# Patient Record
Sex: Female | Born: 1985 | Race: White | Hispanic: No | Marital: Married | Smoking: Never smoker
Health system: Western US, Academic
[De-identification: ages and names within clinical notes are randomized; demographics above are authoritative.]

## PROBLEM LIST (undated history)

## (undated) DIAGNOSIS — F5000 Anorexia nervosa, unspecified: Secondary | ICD-10-CM

## (undated) DIAGNOSIS — F411 Generalized anxiety disorder: Secondary | ICD-10-CM

## (undated) DIAGNOSIS — L700 Acne vulgaris: Secondary | ICD-10-CM

## (undated) HISTORY — DX: Generalized anxiety disorder: F41.1

## (undated) HISTORY — DX: Acne vulgaris: L70.0

## (undated) HISTORY — PX: NO PAST SURGERIES: SHX2092

## (undated) HISTORY — DX: Anorexia nervosa, unspecified: F50.00

## (undated) NOTE — Nursing Note (Signed)
 Formatting of this note is different from the original.  Gave patient 36 Week AVS Instructions     Pended CBC and Syphilis lab orders.  GBS Culture, pended order and setup for collection     Psychosocial Screening completed Yes    PSYCHOSOCIAL SCREENING To

## (undated) NOTE — Progress Notes (Signed)
 Formatting of this note is different from the original.  Images from the original note were not included.  CC: return OB  67 year old [id number] female at [redacted]w[redacted]d here for return OB visit.  No leakage of fluid, vaginal bleeding, dysuria, preeclampsia symptoms,

---
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
## 2010-01-19 ENCOUNTER — Ambulatory Visit (HOSPITAL_COMMUNITY): Admission: RE | Admit: 2010-01-19 | Discharge: 2010-01-19 | Payer: Self-pay | Admitting: Obstetrics & Gynecology

## 2010-07-23 ENCOUNTER — Encounter: Payer: Self-pay | Admitting: Family Medicine

## 2010-09-16 LAB — ABO/RH: ABO/RH(D): A POS

## 2010-11-17 NOTE — Discharge Summary (Signed)
[name], Marquez                 ACCOUNT NO.:  [id number]   MEDICAL RECORD NO.:  [id number]          PATIENT TYPE:  INP   LOCATION:  6150                         FACILITY:  [facility]   PHYSICIAN:  Orie Rout, M.D.DATE OF BIRTH:  Dec 23, 1985   DATE OF ADMISSION:  07/29/2004  DATE OF DISCHARGE:                                 DISCHARGE SUMMARY   CONSULTING PHYSICIAN:  Dr. Candis Musa of cardiology.   PRINCIPAL DIAGNOSES:  1.  Anorexia nervosa.  2.  Bradycardia.  3.  Renal insufficiency.   PRINCIPAL PROCEDURES:  1.  July 29, 2004 - EKG showing low voltage, heart rate of 29, calculated      QTC of 0.37.  2.  Continuous cardiac monitoring with heart rate going from 28 to 61.  3.  IV hydration with maintenance IV fluids D5/half normal saline with 20 of      KCl per liter.   HISTORY:  Ms. Colleen Marquez is a 25 year old female with a diagnosis of anorexia  nervosa followed by her primary care physician, Dr. Maryellen Pile who  presented on July 29, 2004 after she had undergone Holter monitoring and  her heart rate was found to be in the 30s.  She was admitted for  asymptomatic bradycardia for meeting the criteria for inpatient admission  for anorexia nervosa.  She had been followed very closely by her primary  care physician prior to admission.  Her weight in November 2004 was 139.  He  saw her the next time on May 04, 2004 when her weight was 120.  At that  time she was feeling tired, lightheaded, especially with tumbling.  She is a  Writer.  At that time labs were normal including a CBC, mono  spot, EBD serologies, TSH, UA, with the exception of her BUN which was 25  and a creatinine of 1.1.  She was referred in December 2005 to a pediatric  psychiatrist, Dr. Cyndia Skeeters, who began following her.  Unfortunately, she did  not develop a good relationship with him and was eventually referred to Dr.  Richardson Dopp, a psychologist.  Her family, her primary care physician, and Bryn have  been trying to manage this as an outpatient but she was seen weekly by her  primary care physician where her vital signs were taken.  Her heart rate  progressively got lower.  On July 08, 2004 it was 70 with a blood pressure  of 84/30; on July 15, 2004 it was 65 with a blood pressure of 84/38; the  next week had similar values but things came to a head on July 29, 2004  when she was seen and her heart rate was 32 with a blood pressure of 86/48.  A Holter monitor was placed and it was reviewed by Dr. Candis Musa.  She was  admitted for inpatient management of this significant bradycardia.   LABORATORY DATA:  Admission laboratory data on July 29, 2004:  WBC 5.8,  HGB 13.1, HCT 37.3, PLT 198.  Sodium 137, potassium 4.3, chloride 104,  bicarb 25, BUN 32, creatinine 1.5, glucose 72, alk phos 46, AST  35, ALT 45,  total protein 6.4, albumin 4.2, calcium 9.3.  UA:  Specific gravity 1.008,  pH 7, otherwise negative.  Urine pregnancy test negative.  Urine drug screen  negative.  Magnesium on July 30, 2004 2.1.  Phosphorus on July 30, 2004 3.2.  FSH 0.6 - which is low.  Luteinizing hormone less than 0.1 - also  low.   Laboratory data prior to discharge on July 31, 2004:  Sodium 140,  potassium 4.1, chloride 108, bicarb 29, BUN 17, creatinine 1.3, glucose 97,  calcium 9.3, phosphorus 3.1, magnesium 2.2.   HOSPITAL COURSE:  #1 - ANOREXIA NERVOSA.  The patient was seen by our  pediatric psychologist as well as our nutritionist.  We attempted to come up  with an inpatient strategy that could be followed by nursing, physician, and  the ancillary staff here.  This included strict bedrest, constant monitoring  by nursing and family, and caloric goals, as well as goals for her weight  gain.  In terms of her nutrition, she was started on maintenance IV fluids  D5/half normal saline with 20 of KCl at maintenance.  This was continued  throughout hospitalization.  In terms of her caloric  intake, we started with  a goal of 1000 Kcal.  This was increased to 1200 Kcal on hospital day #2.  On hospital day #3, she was seen by the nutritionist.  At that point, she  had already lost weight.  Her admission weight was 49.8 kg on July 29, 2004.  Her weight on July 31, 2004 was 49.1 kg.  We explained to her that  we would increase her daily Kcal by 200 each day if she failed to gain 0.2  kg.  This has been an ongoing struggle with nursing and family.  We  therefore felt very strongly from the beginning that she should be part of a  dedicated eating disorders unit that has the expertise and staffing  available for closer monitoring and implantation of rules.  In terms of her  bedrest, both the patient and parents were very upset with the imposition of  strict bedrest on hospital day #2.  They asked [place] for cardiology input to  determine the safety and necessity of bedrest.  She was seen on hospital day  #3 by Dr. Candis Musa.  He felt that strict bedrest was not indicated from a  cardiac standpoint.  He understood that this might be useful from an eating  disorder treatment plan perspective but did not feel that this was necessary  to be done prior to transfer to a dedicated unit.  He therefore discontinued  the use of strict bedrest.   #2 - BRADYCARDIA.  The patient's initial heart rate was 29.  This came up  somewhat during hospitalization and on the morning of July 31, 2004  ranged from 41 to 61.  She did continue to have periods of dipping to the  30s.  She was monitored continually on telemetry.  She did not have any  significant pauses.  In addition, she did not have pauses over 2 seconds on  her Holter monitoring per Dr. Candis Musa.  She was never symptomatic during her  hospitalization with the bradycardia.   #3 - PSYCHIATRY.  The patient was evaluated by our pediatric psychologist,  Dr. Colvin Caroli.  Unfortunately, there was some frustration with Dr. Dixon Boos style on the  part of the parents and the patient.  Good relationship  was not established.  The  patient had been started on Zoloft 50 mg p.o.  daily on July 28, 2004 prior to hospitalization by her primary care  physician.  This was continued during her hospitalization.   #4 - RENAL INSUFFICIENCY.  The patient was noted to have a rising creatinine  as an outpatient.  As mentioned, she had a value of 1.1 on May 05, 2004.  That increased to 1.3 on June 22, 2004 and to 1.5 on July 24, 2004.  On admission, her creatinine was 1.5 and decreased to 1.3 prior to discharge  with hydration.  This is felt to be likely prerenal in nature but should be  continued to be followed twice weekly until she reaches her baseline value  of 1.1 or below.   #5 - DISPOSITION.  At the time of dictation, the parents were investigating  several inpatient treatment facilities.  We have been in touch with the [facility]  eating disorder unit and are attempting to transfer the patient there.  There are beds available and if the parents and patient agree that this is  the best treatment at this time, we plan to transfer the patient on August 01, 2004.   DISCHARGE INSTRUCTIONS TO THE PATIENT AND FAMILY:  Proceed as planned  directly to the eating disorders unit at [facility].  There, Iriel will be  instructed to follow their protocol.   DISCHARGE MEDICATIONS:  1.  Zoloft 50 mg p.o. daily.  2.  Tylenol 650 mg p.o. q.4h. p.r.n.    HP/MEDQ  D:  07/31/2004  T:  07/31/2004  Job:  [id number]

## 2016-06-28 ENCOUNTER — Ambulatory Visit (INDEPENDENT_AMBULATORY_CARE_PROVIDER_SITE_OTHER): Payer: BC Managed Care – PPO | Admitting: Internal Medicine

## 2016-06-28 ENCOUNTER — Encounter (INDEPENDENT_AMBULATORY_CARE_PROVIDER_SITE_OTHER): Payer: Self-pay | Admitting: Internal Medicine

## 2016-06-28 VITALS — BP 115/77 | HR 57 | Temp 97.7°F | Ht 64.0 in | Wt 126.8 lb

## 2016-06-28 DIAGNOSIS — Z23 Encounter for immunization: Secondary | ICD-10-CM

## 2016-06-28 DIAGNOSIS — Z6821 Body mass index (BMI) 21.0-21.9, adult: Secondary | ICD-10-CM

## 2016-06-28 DIAGNOSIS — Z0000 Encounter for general adult medical examination without abnormal findings: Principal | ICD-10-CM

## 2016-06-28 DIAGNOSIS — F411 Generalized anxiety disorder: Secondary | ICD-10-CM

## 2016-06-28 MED ORDER — ESCITALOPRAM OXALATE 10 MG OR TABS
10.0000 mg | ORAL_TABLET | Freq: Every day | ORAL | 2 refills | Status: DC
Start: 2016-06-28 — End: 2018-10-17

## 2016-06-28 MED ORDER — ACYCLOVIR 800 MG OR TABS
ORAL_TABLET | ORAL | Status: DC
Start: 2016-06-26 — End: 2019-07-20

## 2016-06-28 MED ORDER — ESCITALOPRAM OXALATE 10 MG OR TABS
ORAL_TABLET | ORAL | Status: DC
Start: 2016-06-02 — End: 2016-06-28

## 2016-06-28 MED ORDER — LO LOESTRIN FE 1 MG-10 MCG / 10 MCG PO TABS
ORAL_TABLET | ORAL | Status: DC
Start: 2016-06-11 — End: 2016-06-28

## 2016-06-28 MED ORDER — LO LOESTRIN FE 1 MG-10 MCG / 10 MCG PO TABS
1.0000 | ORAL_TABLET | Freq: Every day | ORAL | 6 refills | Status: DC
Start: 2016-06-28 — End: 2018-09-16

## 2016-06-28 MED ORDER — CLARAVIS 30 MG OR CAPS
ORAL_CAPSULE | ORAL | Status: DC
Start: 2016-06-04 — End: 2019-06-20

## 2016-06-28 NOTE — Patient Instructions (Signed)
Your physician has referred you to the CTIS: Health Information Line based here at [place]. They will contact you within a week to review our specific medication that could be harmful if you became pregnant and to discuss your contraceptive options.

## 2016-06-28 NOTE — Interdisciplinary (Signed)
Per orders of Dr. Valarie ConesMandich, injection of 0.485ml Boostrix lot# [id number] exp 05/31/2018 given by Lorayne BenderPaula Slusar IM left deltoid. Per orders of Dr. Valarie ConesMandich, injection of 0.305ml Fluzone Quad lot# [id number] exp 12/29/2016 given by Lorayne BenderPaula Slusar IM right deltoid.Patient instructed to remain in clinic for 20 minutes afterwards, and to report any adverse reaction to me immediately.

## 2016-06-28 NOTE — Progress Notes (Signed)
.  Chief Complaint   Patient presents with   . Establish Care       HPI: Teresa Zamora is a 30 year old female who presents to establish care and for medication refill.     GAD: well controlled, has been on lexapro x years. Interested in titration off, but wants to wait until after wedding in May.     Anorexia nervousa: hospitalized x 2 in 2006. Denies recurrence of symptoms, even with stress of wedding. 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     Acne: prescribed acutane by her father, he is following her labs.     ROS + for nausea with acyclovir, improved since yesterday.     ROS  Except as documented above, all other systems were reviewed and negative.    Past medical history, surgical history, and family history reviewed and updated today as below. Allergies and medications reviewed and updated as below.    Past Medical History:   Diagnosis Date   . Acne vulgaris     managed by father who is dermatologist    . Anorexia nervosa 2006    hospitalized x 2   . Generalized anxiety disorder     on lexapro x years       Past Surgical History:   Procedure Laterality Date   . NO PAST SURGERIES         Current Outpatient Prescriptions   Medication Sig   . acyclovir (ZOVIRAX) 800 MG tablet    . CLARAVIS 30 MG capsule    . escitalopram (LEXAPRO) 10 MG tablet Take 1 tablet (10 mg) by mouth daily.   . LO LOESTRIN FE 1 MG-10 MCG / 10 MCG tablet Take 1 tablet by mouth daily.     No current facility-administered medications for this visit.        No Known Allergies    Social History   Substance Use Topics   . Smoking status: Never Smoker   . Smokeless tobacco: Never Used   . Alcohol use 5.0 - 10.0 oz/week     1 - 2 Glasses of wine per week     Social History     Social History Narrative    Exercise: walk or run 5-6 days per week. Yoga 2 days per week. Strength training video 1-2 days per week.     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     [place]. 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        Family History   Problem Relation Age of Onset   . Other Mother      DJD (motif 1)    . Hypertension Father    . No Known Problems Sister    . No Known Problems Brother    . Heart Attack Maternal Grandfather 55   . Diabetes Neg Hx    . Cancer Neg Hx        Physical Exam:  BP 115/77 (BP Location: Right arm, BP Patient Position: Sitting, BP cuff size: Regular)  Pulse 57  Temp 97.7 F (36.5 C) (Oral)  Ht 5\' 4"  (1.626 m)  Wt 57.5 kg (126 lb 12.2 oz)  SpO2 99%  BMI 21.76 kg/m2    Gen: Alert, oriented, well appearing, NAD  HEENT: PERRL, MMM, oropharynx clear, external auditory canal normal  Neck: supple  Lymph: No cervical,  submandibular lymphadenopathy  Cardiac: regular rate and rhythm, no M/R/G, no LE edema  Respiratory: CTAB, normal WOB  Abdomen: Soft, NTND, +BS, no rebound or guarding, no masses or hepatosplenomegaly  Skin: No rashes or lesions  Extremities: No clubbing  Neuro: CN's II-XII intact grossly, no gross motor or sensory deficits, gait normal  MSK: normal bulk and tone     Labs:  Outside labs reviewed 04/2016 including normal CBC, CMP     FLP   TC 152, TG 133, HDL 60, LDL 65  Assessment and Plan:    Teresa Zamora was seen today for establish care.    Diagnoses and all orders for this visit:    Routine medical exam  - vaccines per below, otherwise HM (including laboratory screening) up to date.   -     LO LOESTRIN FE 1 MG-10 MCG / 10 MCG tablet; Take 1 tablet by mouth daily.    Generalized anxiety disorder  Chronic, currently in remission on lexapro 10. May be interested in trial off the med in the future, encouraged patient to follow-up for this.   -     escitalopram (LEXAPRO) 10 MG tablet; Take 1 tablet (10 mg) by mouth daily.    Influenza vaccine needed  -     Flu Vaccine, Preserv Free, 3 years and older, IM    Need for Tdap vaccination  -     Tdap (BOOSTRIX/ADACEL) for patients 6211-29  years old      Health Care Maintenance:  Vaccinations:   - Flu: today  - Tdap: today  - Shingles:  >60  - Pneumovax: >65  - Prevnar: >65    Cancer screening:  Pap/pelvic: 2 years ago, due 2018 pending records (21-65, hvp cotesting in age 29 and older)  Colonoscopy: (every 10 years, 3350-85)  Mammogram: (50-74, every 2 years)    Other screening:  ASCVD Risk: The ASCVD Risk score (Goff DC Montez HagemanJr, et al., 2013) failed to calculate for the following reasons:    The 2013 ASCVD risk score is only valid for ages 5340 to 6479  DEXA: (> 65 for f and > 70 for m or < 65 if increased risk of fx, then q 5 years)    HIV:   Hepatitis C: (one time test for individuals born between [birth year]-1965)    Patient Instructions     Your physician has referred you to the CTIS: Health Information Line based here at [place]. They will contact you within a week to review our specific medication that could be harmful if you became pregnant and to discuss your contraceptive options.                                                      Follow-up in 6 months for annual exam with PAP     Francisco CapuchinNicole Clark Clowdus, MD

## 2016-08-16 ENCOUNTER — Encounter (INDEPENDENT_AMBULATORY_CARE_PROVIDER_SITE_OTHER): Payer: Self-pay | Admitting: Internal Medicine

## 2016-08-16 DIAGNOSIS — Z0000 Encounter for general adult medical examination without abnormal findings: Principal | ICD-10-CM

## 2016-10-08 ENCOUNTER — Ambulatory Visit (INDEPENDENT_AMBULATORY_CARE_PROVIDER_SITE_OTHER): Payer: BC Managed Care – PPO | Admitting: Internal Medicine

## 2016-10-08 ENCOUNTER — Other Ambulatory Visit (INDEPENDENT_AMBULATORY_CARE_PROVIDER_SITE_OTHER): Payer: BC Managed Care – PPO

## 2016-10-08 ENCOUNTER — Other Ambulatory Visit (INDEPENDENT_AMBULATORY_CARE_PROVIDER_SITE_OTHER): Payer: BC Managed Care – PPO | Attending: Internal Medicine

## 2016-10-08 VITALS — BP 106/80 | HR 76 | Temp 98.6°F | Ht 64.0 in | Wt 125.0 lb

## 2016-10-08 DIAGNOSIS — H6120 Impacted cerumen, unspecified ear: Secondary | ICD-10-CM

## 2016-10-08 DIAGNOSIS — R55 Syncope and collapse: Principal | ICD-10-CM

## 2016-10-08 DIAGNOSIS — H6121 Impacted cerumen, right ear: Secondary | ICD-10-CM

## 2016-10-08 LAB — CBC WITH DIFF, BLOOD
ANC-Automated: 3.7 10*3/uL (ref 1.6–7.0)
Abs Lymphs: 2.6 10*3/uL (ref 0.8–3.1)
Abs Monos: 0.5 10*3/uL (ref 0.2–0.8)
Eosinophils: 1 %
Hct: 40.5 % (ref 34.0–45.0)
Hgb: 14.4 gm/dL (ref 11.2–15.7)
Lymphocytes: 38 %
MCH: 31.6 pg (ref 26.0–32.0)
MCHC: 35.6 g/dL (ref 32.0–36.0)
MCV: 88.8 um3 (ref 79.0–95.0)
MPV: 13.8 fL — ABNORMAL HIGH (ref 9.4–12.4)
Monocytes: 7 %
Plt Count: 169 10*3/uL (ref 140–370)
RBC: 4.56 10*6/uL (ref 3.90–5.20)
RDW: 12.3 % (ref 12.0–14.0)
Segs: 54 %
WBC: 6.8 10*3/uL (ref 4.0–10.0)

## 2016-10-08 LAB — COMPREHENSIVE METABOLIC PANEL, BLOOD
ALT (SGPT): 18 U/L (ref 0–33)
AST (SGOT): 21 U/L (ref 0–32)
Albumin: 4.4 g/dL (ref 3.5–5.2)
Alkaline Phos: 43 U/L (ref 35–140)
Anion Gap: 11 mmol/L (ref 7–15)
BUN: 12 mg/dL (ref 6–20)
Bicarbonate: 29 mmol/L (ref 22–29)
Bilirubin, Tot: 0.54 mg/dL (ref <1.2)
Calcium: 9.4 mg/dL (ref 8.5–10.6)
Chloride: 102 mmol/L (ref 98–107)
Creatinine: 0.88 mg/dL (ref 0.51–0.95)
GFR: >60 mL/min
Glucose: 84 mg/dL (ref 70–99)
Potassium: 4.2 mmol/L (ref 3.5–5.1)
Sodium: 142 mmol/L (ref 136–145)
Total Protein: 7 g/dL (ref 6.0–8.0)

## 2016-10-08 LAB — RBC MORPHOLOGY
Plt Est: ADEQUATE
RBC Comment: NORMAL

## 2016-10-08 LAB — TSH, BLOOD: TSH: 1.87 u[IU]/mL (ref 0.27–4.20)

## 2016-10-08 NOTE — Patient Instructions (Addendum)
Labs and EKG today - we will let you know if labs are abnormal.     Let us know if any further symptoms including chest pain, palpitations, repeat fainting or feel close to fainting, worsening dizziness.     Consider flonase or allergy medication (ie zyrtec, allegra) if your nasal congestion and ear fullness persist.     Debrox drops twice daily

## 2016-10-08 NOTE — Progress Notes (Signed)
.  Chief Complaint   Patient presents with    Syncope       HPI: Teresa Zamora is a 31 year old female who presents for above chief complaint.     Syncope:   - had final dress fitting on Saturday. Was standing for long period (45 minutes). No breakfast. Started to feel lightheaded. Sat down, had loss of consciousness x seconds witnessed by her sister. No seizure like movements, tongue biting. Felt OK afterwards.   - paramedics came and they checked her blood sugar (after she had eaten 2 crackers, snickers, water) and blood sugar was normal.   - no chest pain, palpitations, numnbess, tingling. No nausea, just felt lightheaded.   - no further dizziness since, energy slightly decreased.  weight relatively stable from last visit 125 from 126 12 oz     ROS  12 pt ROS reviewed and otherwise negative.    Past medical history, surgical history, and family history reviewed and updated today as below. Allergies and medications reviewed and updated as below.    Past Medical History:   Diagnosis Date    Acne vulgaris     managed by father who is dermatologist     Anorexia nervosa 2006    hospitalized x 2    Generalized anxiety disorder     on lexapro x years       Past Surgical History:   Procedure Laterality Date    NO PAST SURGERIES         Current Outpatient Prescriptions   Medication Sig    acyclovir (ZOVIRAX) 800 MG tablet     CLARAVIS 30 MG capsule     escitalopram (LEXAPRO) 10 MG tablet Take 1 tablet (10 mg) by mouth daily.    LO LOESTRIN FE 1 MG-10 MCG / 10 MCG tablet Take 1 tablet by mouth daily.     No current facility-administered medications for this visit.        No Known Allergies    Social History   Substance Use Topics    Smoking status: Never Smoker    Smokeless tobacco: Never Used    Alcohol use 5.0 - 10.0 oz/week     1 - 2 Glasses of wine per week     Social History     Social History Narrative    Exercise: walk or run 5-6 days per week. Yoga 2 days per week. Strength training video 1-2 days per  week.     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     Getting married in May in [place] where her family lives. 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        Family History   Problem Relation Age of Onset    Other Mother      DJD (motif 1)     Hypertension Father     No Known Problems Sister     No Known Problems Brother     Heart Attack Maternal Grandfather 44    Diabetes Neg Hx     Cancer Neg Hx        Physical Exam:  BP 106/80 (BP Location: Left arm, BP Patient Position: Standing)   Pulse 76   Temp 98.6 F (37 C) (Oral)   Ht '5\' 4"'$  (1.626 m)   Wt 56.7 kg (125 lb)   SpO2 97%   BMI 21.46 kg/m2    Gen: Alert, oriented, well appearing, NAD  EAR: moderate cerumen right ear.  Hearing grossly intact, normal light reflex bilaterally.   EYE: PERRL  Neck: supple  Cardiac: regular rate and rhythm, no M/R/G, no LE edema  Respiratory: CTAB, normal WOB  Abdomen: Soft, NTND, +BS, no rebound or guarding, no masses or hepatosplenomegaly  Skin: No rashes or lesions  Extremities: No clubbing  .Neuro: Alert and oriented. Speech fluent. CN 2-12 intact. 5/5 biceps, triceps, grip, quadriceps, hamstring strength. Sensation to light touch intact throughout. DTR 2+ throughout. No finger to nose dysmetria. Normal gate.       Labs:    No results found for: WBC, RBC, HGB, HCT, MCV, MCHC, RDW, PLT, MPV    No results found for: NA, K, CL, BICARB, BUN, CREAT, GLU, Corinne    No results found for: AST, ALT, GGT, LDH, ALK, TP, ALB, TBILI, DBILI    No results found for: INR, PTT    No results found for: A1C    No results found for: CHOL, HDL, LDLCALC, TRIG, LDLDIRECT    No results found for: TSH      Assessment and Plan:    Yoneko was seen today for syncope.    Diagnoses and all orders for this visit:    Syncope, unspecified syncope type  Patient with single episode of syncope after prolonged standing and low PO intake morning of wedding dress fitting. No preceding cardio or neuro exams. Normal CV exam and EKG today  with no other s/s of structural heart disease. No e/o QT, PR prolongation on EKG. Normal neurologic exam , no witness seizure episode. Consider vasovagal versus orthostatic syncope (orthostsatics negative today but volume status at time of exam possibly different. Patient with h/o eating disorder, treated. Weight stable from last visit will watch. Check electrolytes today. Patient advised to follow-up for any further alarm signs (see instructions) or further syncopal versus presyncopal events.   -     Comprehensive Metabolic Panel - See Instructions; Future  -     CBC w/ Diff Lavender; Future  -     TSH, Blood - See Instructions; Future  -     ECG, In Clinic    Impacted cerumen, unspecified laterality  Patient with mild cerumen impaction right ear with subjective sense of fullness that ear. Attempted to remove with lavage unable to tolerate.   - debrox drops  - consider fullness 2/2 allergies, consider flonase or zyrtec  - ENT refer if no improvement.     Health Care Maintenance:    Other screening:  ASCVD Risk: The ASCVD Risk score Mikey Bussing DC Brooke Bonito, et al., 2013) failed to calculate for the following reasons:    The 2013 ASCVD risk score is only valid for ages 79 to 7      Patient Instructions   Labs and EKG today - we will let you know if labs are abnormal.     Let us know if any further symptoms including chest pain, palpitations, repeat fainting or feel close to fainting, worsening dizziness.     Consider flonase or allergy medication (ie zyrtec, allegra) if your nasal congestion and ear fullness persist.     Debrox drops twice daily                                                       Follow-up in 1 year or sooner as needed  Flannery Cavallero, MD

## 2016-10-08 NOTE — Interdisciplinary (Signed)
Blood drawn from right arm with 21 gauge needle. 2 tubes taken.   Patient identity authenticated by Guadalupe Martinez.

## 2017-10-30 LAB — GENERIC EXTERNAL RESULT (UNMAPPED): HGBA1C%: 5.2 % — NL (ref 4.6–5.6)

## 2018-09-09 ENCOUNTER — Ambulatory Visit (HOSPITAL_BASED_OUTPATIENT_CLINIC_OR_DEPARTMENT_OTHER): Payer: BC Managed Care – PPO | Admitting: Physician Assistant

## 2018-09-16 ENCOUNTER — Telehealth (INDEPENDENT_AMBULATORY_CARE_PROVIDER_SITE_OTHER): Payer: Self-pay | Admitting: Obstetrics & Gynecology

## 2018-09-16 DIAGNOSIS — Z0000 Encounter for general adult medical examination without abnormal findings: Principal | ICD-10-CM

## 2018-09-16 MED ORDER — LO LOESTRIN FE 1 MG-10 MCG / 10 MCG PO TABS
1.0000 | ORAL_TABLET | Freq: Every day | ORAL | 1 refills | Status: DC
Start: 2018-09-16 — End: 2018-10-20

## 2018-09-18 ENCOUNTER — Telehealth (INDEPENDENT_AMBULATORY_CARE_PROVIDER_SITE_OTHER): Payer: Self-pay | Admitting: Obstetrics & Gynecology

## 2018-10-10 ENCOUNTER — Encounter (INDEPENDENT_AMBULATORY_CARE_PROVIDER_SITE_OTHER): Payer: Self-pay | Admitting: Hospital

## 2018-10-15 ENCOUNTER — Telehealth (INDEPENDENT_AMBULATORY_CARE_PROVIDER_SITE_OTHER): Payer: Self-pay | Admitting: Obstetrics & Gynecology

## 2018-10-16 ENCOUNTER — Telehealth (INDEPENDENT_AMBULATORY_CARE_PROVIDER_SITE_OTHER): Payer: Self-pay | Admitting: Obstetrics & Gynecology

## 2018-10-16 ENCOUNTER — Encounter (INDEPENDENT_AMBULATORY_CARE_PROVIDER_SITE_OTHER): Payer: Self-pay

## 2018-10-17 ENCOUNTER — Encounter (INDEPENDENT_AMBULATORY_CARE_PROVIDER_SITE_OTHER): Payer: Self-pay | Admitting: Obstetrics & Gynecology

## 2018-10-17 ENCOUNTER — Telehealth (INDEPENDENT_AMBULATORY_CARE_PROVIDER_SITE_OTHER): Payer: Self-pay | Admitting: Obstetrics & Gynecology

## 2018-10-17 DIAGNOSIS — Z3169 Encounter for other general counseling and advice on procreation: Principal | ICD-10-CM

## 2018-10-17 MED ORDER — VITAPEARL 30-1.4-200 MG PO CPCR
1.0000 | ORAL_CAPSULE | Freq: Every day | ORAL | 11 refills | Status: AC
Start: 2018-10-17 — End: ?

## 2018-10-18 ENCOUNTER — Other Ambulatory Visit (INDEPENDENT_AMBULATORY_CARE_PROVIDER_SITE_OTHER): Payer: Self-pay | Admitting: Obstetrics & Gynecology

## 2018-10-18 DIAGNOSIS — Z0000 Encounter for general adult medical examination without abnormal findings: Principal | ICD-10-CM

## 2018-10-20 MED ORDER — LO LOESTRIN FE 1 MG-10 MCG / 10 MCG PO TABS
ORAL_TABLET | ORAL | 1 refills | Status: DC
Start: 2018-10-20 — End: 2018-11-10

## 2018-10-21 ENCOUNTER — Ambulatory Visit (INDEPENDENT_AMBULATORY_CARE_PROVIDER_SITE_OTHER): Payer: Self-pay | Admitting: Obstetrics & Gynecology

## 2018-10-21 ENCOUNTER — Telehealth (INDEPENDENT_AMBULATORY_CARE_PROVIDER_SITE_OTHER): Payer: Self-pay | Admitting: Obstetrics & Gynecology

## 2018-10-21 ENCOUNTER — Encounter (INDEPENDENT_AMBULATORY_CARE_PROVIDER_SITE_OTHER): Payer: Self-pay | Admitting: Obstetrics & Gynecology

## 2018-11-10 ENCOUNTER — Other Ambulatory Visit (INDEPENDENT_AMBULATORY_CARE_PROVIDER_SITE_OTHER): Payer: Self-pay | Admitting: Obstetrics & Gynecology

## 2018-11-10 MED ORDER — LO LOESTRIN FE 1 MG-10 MCG / 10 MCG PO TABS
ORAL_TABLET | ORAL | 1 refills | Status: DC
Start: 2018-11-10 — End: 2019-06-20

## 2018-12-12 ENCOUNTER — Ambulatory Visit (INDEPENDENT_AMBULATORY_CARE_PROVIDER_SITE_OTHER): Payer: Self-pay | Admitting: Obstetrics & Gynecology

## 2018-12-18 ENCOUNTER — Encounter (INDEPENDENT_AMBULATORY_CARE_PROVIDER_SITE_OTHER): Payer: Self-pay | Admitting: Hospital

## 2018-12-23 ENCOUNTER — Encounter (INDEPENDENT_AMBULATORY_CARE_PROVIDER_SITE_OTHER): Payer: Self-pay | Admitting: Obstetrics & Gynecology

## 2018-12-23 ENCOUNTER — Ambulatory Visit (INDEPENDENT_AMBULATORY_CARE_PROVIDER_SITE_OTHER): Payer: Self-pay | Admitting: Obstetrics & Gynecology

## 2018-12-23 VITALS — BP 114/78 | Ht 64.0 in | Wt 130.0 lb

## 2018-12-23 LAB — LIPID PANEL
Cholesterol: 190 mg/dL (ref 100–200)
HDL Risk Factor: 2.4 CALC (ref <=4.3)
HDL: 79 mg/dL (ref >=56)
LDL (Calculated): 97.8 mg/dL (ref 0.0–130.0)
Triglycerides: 66 mg/dL (ref 0–150)

## 2018-12-23 LAB — CBC WITH DIFF, BLOOD
Granulocyte %: 56.3 % (ref 43.0–76.0)
Granulocytes(Absolute): 4 10*3/uL (ref 1.7–8.0)
HCT: 43.1 % (ref 34.0–46.6)
HGB: 14.5 g/dL (ref 11.1–15.9)
Lymphocyte %: 40.2 % (ref 17.0–48.0)
Lymphocytes(Absolute): 2.7 10*3/uL (ref 0.7–5.0)
MCH: 31.7 pg (ref 26.6–33.0)
MCHC: 33.7 g/dL (ref 31.5–35.7)
MCV: 94 fL (ref 79.0–97.0)
Monocyte%: 3.5 % (ref 2.0–10.0)
Monocytes(Absolute): 0.2 10*3/uL (ref 0.1–1.0)
PLT: 149 10*3/uL — ABNORMAL LOW (ref 150–379)
RBC: 4.58 10*12/L (ref 3.77–5.28)
RDW: 13.1 % (ref 12.3–15.4)
WBC: 6.9 10*9/L (ref 3.4–10.8)

## 2018-12-23 LAB — THYROID FUNCTION PANEL (ULTRASENSITIVE TSH + FREE T4)
Free T4: 0.8 ng/dl (ref 0.61–1.12)
TSH: 1.927 u[IU]/mL (ref 0.450–5.330)

## 2018-12-23 LAB — GLUCOSE, BLOOD: Glucose: 86 mg/dL (ref 69–99)

## 2018-12-23 LAB — ANNUAL EXAM LAB PANEL WITH GLUCOSE

## 2018-12-23 LAB — RUBELLA IGG ANTIBODY, BLOOD: Rubella: 102.7 IU/ml (ref >14.99)

## 2018-12-24 LAB — MEASLES IGG ANTIBODY, BLOOD: Rubeola Ab, IgG: >300 AU/ML (ref >16.4)

## 2018-12-24 LAB — GLYCOSYLATED HGB(A1C), BLOOD: Hemoglobin A1c: 5 % (ref 4.8–5.6)

## 2018-12-24 LAB — VARICELLA IGG ANTIBODY (ELISA), BLOOD: Varicella Zoster IgG: 1573 INDEX (ref >165)

## 2018-12-25 LAB — PAP IG (IMAGE GUIDED) AND HPV-HR, W/ REFLEX TO 16/18,45 IF HPV POSITIVE - LABCORP
HPV, Aptima High 16/18,45: NEGATIVE
PAP .1: 0

## 2019-01-09 ENCOUNTER — Encounter (INDEPENDENT_AMBULATORY_CARE_PROVIDER_SITE_OTHER): Payer: Self-pay | Admitting: Hospital

## 2019-02-06 ENCOUNTER — Telehealth (INDEPENDENT_AMBULATORY_CARE_PROVIDER_SITE_OTHER): Payer: Self-pay | Admitting: Obstetrics & Gynecology

## 2019-02-06 NOTE — Telephone Encounter (Signed)
Pt advised per hg that varicella and measles is part of the preconception panel

## 2019-02-06 NOTE — Telephone Encounter (Signed)
Pt called asking why we ordered measles and varicella when she had that testing a few years ago?  She doesn't want to pay for it

## 2019-04-02 ENCOUNTER — Encounter (INDEPENDENT_AMBULATORY_CARE_PROVIDER_SITE_OTHER): Payer: Self-pay

## 2019-04-22 ENCOUNTER — Encounter (INDEPENDENT_AMBULATORY_CARE_PROVIDER_SITE_OTHER): Payer: Self-pay

## 2019-06-01 ENCOUNTER — Telehealth (INDEPENDENT_AMBULATORY_CARE_PROVIDER_SITE_OTHER): Payer: Self-pay | Admitting: Obstetrics & Gynecology

## 2019-06-01 NOTE — Telephone Encounter (Signed)
Pt called with +UPT  Lmp is 04-30-19  Made first two appts

## 2019-06-02 ENCOUNTER — Encounter (INDEPENDENT_AMBULATORY_CARE_PROVIDER_SITE_OTHER): Payer: Self-pay | Admitting: Hospital

## 2019-06-02 NOTE — Progress Notes (Signed)
Pt called very anxious   Ordered Quants and prog and to repeat in 48 hours  Made video visit with lg on 06-03-19

## 2019-06-03 ENCOUNTER — Telehealth (INDEPENDENT_AMBULATORY_CARE_PROVIDER_SITE_OTHER): Payer: Self-pay | Admitting: Obstetrics & Gynecology

## 2019-06-03 NOTE — Progress Notes (Signed)
Patient Verification & Telemedicine Consent:   I am proceeding with this evaluation at the direct request of the patient. I have verified this is the correct patient and have obtained verbal consent and written consent from the patient/ surrogate to perform this voluntary telemedicine evaluation (including obtaining history, performing examination and reviewing data provided by the patient). The patient/ surrogate has the right to refuse this evaluation. I have explained risks (including potential loss of confidentiality), benefits, alternatives, and the potential need for subsequent face to face care. Patient/ surrogate understands that there is a risk of medical inaccuracies given that our recommendations will be made based on reported data (and we must therefore assume this information is accurate). Knowing that there is a risk that this information is not reported accurately, and that the telemedicine video, audio, or data feed may be incomplete, the patient agrees to proceed with evaluation and holds [place] harmless knowing these risks. In this evaluation, we will be providing recommendations only. The ultimate decision to follow, or not follow, these recommendations will be left to the bedside treating/ requesting practitioner. The patient/ surrogate has been notified that other healthcare professionals (including students, residents and Engineer, maintenance) may be involved in this audio-video evaluation. All laws concerning confidentiality and patient access to medical records and copies of medical records apply to telemedicine.  The patient has acknowledged that they had obtained the [facility] Notice of Privacy Practices from the [employer].  I have reviewed this above verification and consent paragraph with the patient.  If the patient is not capacitated to understand the above, since this is not an emergency evaluation, the visit will be rescheduled until such time that the patient can consent, or a  designated surrogate is available to consent.    Demographics:   Medical Record #: [id number]   Date: June 03, 2019   Patient Name: Teresa Zamora   DOB: 1986-05-08   Age: 33 year old   Sex: female   Location: Home address on file     Evaluator(s):   Georgana Strom was evaluated by me today.   Clinic Location: [facility]  [facility]  [address]  [address]   [place] [address]  [phone]     18 minutes of what became a 20 minute appointment was spent face to face with patient/cargiver in coordinating care and counseling for the below issues.     GYN Problem Visit Note    HPI:   33 year old [id number] presenting for discussion of multiple questions/concerns.    Past Medical History:   Diagnosis Date   . Acne vulgaris     managed by father who is dermatologist    . Anorexia nervosa 2006    hospitalized x 2   . Generalized anxiety disorder        Past Surgical History:   Procedure Laterality Date   . NO PAST SURGERIES         OB History   Gravida Para Term Preterm AB Living   2 0 0 0 2 0   SAB TAB Ectopic Multiple Live Births   0 2 0 0 0        Having periods    No Known Allergies    Current Outpatient Medications on File Prior to Visit   Medication Sig Dispense Refill   . acyclovir (ZOVIRAX) 800 MG tablet      . CLARAVIS 30 MG capsule      .  LO LOESTRIN FE 1 MG-10 MCG / 10 MCG tablet take 1 tablet by mouth once daily 28 tablet 1   . Prenat-FeFum-Fered-FA-DHA w/oA (VITAPEARL) 30-1.4-200 MG CPCR Take 1 tablet by mouth daily. 30 capsule 11     No current facility-administered medications on file prior to visit.          EXAM: n/a  There were no vitals taken for this visit. There is no height or weight on file to calculate BMI.  Gen: no acute distress      ASSESSMENT AND PLAN:    1. Early stage of pregnancy, many questions  All questions answered - discussed diet, supplements (on prenatal vitamins and DHA),  activity including exercise and sexual activity, travel  Reviewed visit schedule (labs,sono), New  OB      Follow up as above    Ottie Glazier, MD

## 2019-06-09 ENCOUNTER — Telehealth (INDEPENDENT_AMBULATORY_CARE_PROVIDER_SITE_OTHER): Payer: Self-pay | Admitting: Obstetrics & Gynecology

## 2019-06-09 ENCOUNTER — Other Ambulatory Visit (INDEPENDENT_AMBULATORY_CARE_PROVIDER_SITE_OTHER): Payer: Self-pay

## 2019-06-09 LAB — CBC WITH DIFF, BLOOD
Granulocyte %: 66.6 % (ref 43.0–76.0)
Granulocytes(Absolute): 5.4 10*3/uL (ref 1.7–8.0)
HCT: 41.3 % (ref 34.0–46.6)
HGB: 14.1 g/dL (ref 11.1–15.9)
Lymphocyte %: 29 % (ref 17.0–48.0)
Lymphocytes(Absolute): 2.2 10*3/uL (ref 0.7–5.0)
MCH: 32 pg (ref 26.6–33.0)
MCHC: 34.2 g/dL (ref 31.5–35.7)
MCV: 94 fL (ref 79.0–97.0)
Monocyte%: 4.4 % (ref 2.0–10.0)
Monocytes(Absolute): 0.3 10*3/uL (ref 0.1–1.0)
PLT: 184 10*3/uL (ref 150–379)
RBC: 4.41 10*12/L (ref 3.77–5.28)
RDW: 12 % — ABNORMAL LOW (ref 12.3–15.4)
WBC: 7.9 10*9/L (ref 3.4–10.8)

## 2019-06-09 LAB — HCG QUANTITATIVE, BLOOD: BETA HCG: 7008 IU/L

## 2019-06-09 NOTE — Telephone Encounter (Signed)
Pt called, advised of BHCG and CBC results. Will add progesterone.  Repeat labs 48 hours.

## 2019-06-10 ENCOUNTER — Telehealth (INDEPENDENT_AMBULATORY_CARE_PROVIDER_SITE_OTHER): Payer: Self-pay | Admitting: Obstetrics & Gynecology

## 2019-06-10 LAB — PROGESTERONE: Progesterone: 23.4 ng/ml

## 2019-06-10 NOTE — Telephone Encounter (Signed)
Pt informed labs consistent with early pregnancy and to repeat in 48 hours per hg

## 2019-06-10 NOTE — Telephone Encounter (Signed)
-----   Message from Nena Jordan, MD sent at 06/09/2019  6:00 PM PST -----  Please inform labs normal, cw with early pregnancy.  Repeat in 48 hours with progesterone.

## 2019-06-11 ENCOUNTER — Other Ambulatory Visit (INDEPENDENT_AMBULATORY_CARE_PROVIDER_SITE_OTHER): Payer: Self-pay

## 2019-06-11 ENCOUNTER — Encounter (INDEPENDENT_AMBULATORY_CARE_PROVIDER_SITE_OTHER): Payer: Self-pay | Admitting: Obstetrics & Gynecology

## 2019-06-11 ENCOUNTER — Telehealth (INDEPENDENT_AMBULATORY_CARE_PROVIDER_SITE_OTHER): Payer: Self-pay | Admitting: Obstetrics & Gynecology

## 2019-06-11 LAB — HCG QUANTITATIVE, BLOOD: BETA HCG: 14569 IU/L

## 2019-06-11 LAB — PROGESTERONE: Progesterone: 17.4 ng/ml

## 2019-06-11 NOTE — Progress Notes (Signed)
Orders placed.

## 2019-06-11 NOTE — Telephone Encounter (Signed)
Pt advised per hg that her quants and prog look great  Pt has two appts set up

## 2019-06-18 ENCOUNTER — Encounter (INDEPENDENT_AMBULATORY_CARE_PROVIDER_SITE_OTHER): Payer: Self-pay | Admitting: Gynecology

## 2019-06-18 ENCOUNTER — Other Ambulatory Visit (INDEPENDENT_AMBULATORY_CARE_PROVIDER_SITE_OTHER): Payer: Self-pay

## 2019-06-18 ENCOUNTER — Ambulatory Visit (INDEPENDENT_AMBULATORY_CARE_PROVIDER_SITE_OTHER): Payer: BC Managed Care – PPO | Admitting: Gynecology

## 2019-06-18 VITALS — BP 104/62 | Wt 124.0 lb

## 2019-06-18 DIAGNOSIS — Z3490 Encounter for supervision of normal pregnancy, unspecified, unspecified trimester: Secondary | ICD-10-CM

## 2019-06-18 NOTE — Progress Notes (Signed)
GYN note    ASSESSMENT AND PLAN:  33 year old [id number] presenting for evaluation of early pregnancy. Ultrasound confirms IUP.  - reassurance provided to patient  - questions about exercise in pregnancy answered.  - recommend follow up in 2-3 weeks to confirm growth of pregnancy. As she is transferring to [facility], she can either do that here or there  - precautions reviewed.        Idelia Salm, MD    I spent more than 15 minutes face-to-face with the patient of which greater than 50% was spent counseling the patient.        ____________________________________________________________    CC: Recheck      HPI: 33 year old [id number] presenting for evaluation of early pregnancy. She should be 7 weeks 0 days. She reports some cramping at 5-6 weeks.   No bleeding.  She has noticed that her feet are colder than usual and is concerned    Switching to [place] on 07/03/2019.    Ultrasound today demonstrates a live IUP at 6 weeks 1 day with FHTs of 98 bpm.       Technician Only  on 06/11/2019   Component Date Value Ref Range Status   . BETA HCG 06/11/2019 14,569.0  IU/L Final    Comment: EXPECTED RANGES: 3-4 WEEKS POST LMP  3.2-100                   5TH WEEK            100-1,000                   6TH WEEK            1,000-10,000                   2ND TRIMESTER       10,000-30,000                   Not Pregnant        < or = 3.1     . Progesterone 06/11/2019 17.4  ng/ml Final    Comment: Access 2  FEMALE EXPECTED RANGES:       MID-FOLLICULAR PHASE  0.31-1.52                                MID-LUTEAL PHASE      5.16-18.56                PREGNANT:                             4.73-50.74        POST MENOPAUSAUL:       UNTREATED             <0.08-0.78     Technician Only  on 06/09/2019   Component Date Value Ref Range Status   . BETA HCG 06/09/2019 7,008.0  IU/L Final    Comment: EXPECTED RANGES: 3-4 WEEKS POST LMP  3.2-100                   5TH WEEK            100-1,000                   6TH WEEK            1,000-10,000  2ND TRIMESTER       10,000-30,000                   Not Pregnant        < or = 3.1     . WBC 06/09/2019 7.9  3.4 - 10.8 x 10*9/L Final   . RBC 06/09/2019 4.41  3.77 - 5.28 x 10*12/L Final   . HGB 06/09/2019 14.1  11.1 - 15.9 g/dL Final   . HCT 78/29/5621 41.3  34.0 - 46.6 % Final   . MCV 06/09/2019 94.0  79.0 - 97.0 fL Final   . MCH 06/09/2019 32.0  26.6 - 33.0 pg Final   . MCHC 06/09/2019 34.2  31.5 - 35.7 g/dL Final   . RDW 30/86/5784 12.0* 12.3 - 15.4 % Final   . PLT 06/09/2019 184  150 - 379 x 10*3/uL Final   . Granulocyte % 06/09/2019 66.6  43.0 - 76.0 % Final   . Lymphocyte % 06/09/2019 29.0  17.0 - 48.0 % Final   . Monocyte% 06/09/2019 4.4  2.0 - 10.0 % Final   . Granulocytes(Absolute) 06/09/2019 5.4  1.7 - 8.0 x 10*3/uL Final   . Lymphocytes(Absolute) 06/09/2019 2.2  0.7 - 5.0 x 10*3/uL Final   . Monocytes(Absolute) 06/09/2019 0.3  0.1 - 1.0 x 10*3/uL Final   . Progesterone 06/09/2019 23.4  ng/ml Final    Comment: Access 2  FEMALE EXPECTED RANGES:       MID-FOLLICULAR PHASE  0.31-1.52                                MID-LUTEAL PHASE      5.16-18.56                PREGNANT:                             4.73-50.74        POST MENOPAUSAUL:       UNTREATED             <0.08-0.78             Past Medical History:   Diagnosis Date   . Acne vulgaris     managed by father who is dermatologist    . Anorexia nervosa 2006    hospitalized x 2   . Generalized anxiety disorder        Past Surgical History:   Procedure Laterality Date   . NO PAST SURGERIES         OB History   Gravida Para Term Preterm AB Living   3 0 0 0 2 0   SAB TAB Ectopic Multiple Live Births   0 2 0 0 0        No Known Allergies    Current Outpatient Medications on File Prior to Visit   Medication Sig Dispense Refill   . acyclovir (ZOVIRAX) 800 MG tablet      . CLARAVIS 30 MG capsule      . LO LOESTRIN FE 1 MG-10 MCG / 10 MCG tablet take 1 tablet by mouth once daily 28 tablet 1   . Prenat-FeFum-Fered-FA-DHA w/oA (VITAPEARL) 30-1.4-200 MG CPCR  Take 1 tablet by mouth daily. 30 capsule 11     No current facility-administered medications on file prior to visit.        Review of Systems  EXAM:   BP 104/62   Wt 56.2 kg (124 lb)   LMP 04/30/2019   BMI 21.28 kg/m  Body mass index is 21.28 kg/m.  Physical Exam      Idelia Salm, MD

## 2019-06-19 ENCOUNTER — Encounter (INDEPENDENT_AMBULATORY_CARE_PROVIDER_SITE_OTHER): Payer: Self-pay | Admitting: Gynecology

## 2019-07-02 ENCOUNTER — Ambulatory Visit (INDEPENDENT_AMBULATORY_CARE_PROVIDER_SITE_OTHER): Payer: BC Managed Care – PPO | Admitting: Gynecology

## 2019-07-02 ENCOUNTER — Encounter (INDEPENDENT_AMBULATORY_CARE_PROVIDER_SITE_OTHER): Payer: Self-pay | Admitting: Gynecology

## 2019-07-02 ENCOUNTER — Other Ambulatory Visit (INDEPENDENT_AMBULATORY_CARE_PROVIDER_SITE_OTHER): Payer: Self-pay

## 2019-07-02 VITALS — BP 108/60 | Wt 124.0 lb

## 2019-07-02 DIAGNOSIS — Z3490 Encounter for supervision of normal pregnancy, unspecified, unspecified trimester: Secondary | ICD-10-CM

## 2019-07-02 NOTE — Progress Notes (Signed)
GYN note    ASSESSMENT AND PLAN:  33 year old [id number] presenting for evaluation of early pregnancy.  - reassurance regarding pregnancy and appropriate growth on ultrasound.  -would change EDC to February 12, 2020 based on ultrasound today  -we discussed noninvasive prenatal testing that can be done as early as [redacted] weeks gestation if she desires.  She will discuss this with her [facility] team        Idelia Salm, MD          ____________________________________________________________    CC: Recheck      HPI: 33 year old [id number] presenting for evaluation of early pregnancy.  She was seen 2 weeks ago and had an ultrasound which demonstrated live intrauterine pregnancy at 6 weeks and 1 day with fetal heart tones of 90 beats per minute.  She was nervous about the fetal heart tones and wanted to have a repeat ultrasound in 2 weeks.  She is switching care to [facility] at the beginning of the year.    Ultrasound today demonstrates a live intrauterine pregnancy measuring 7 weeks and 6 days.  There is fetal heart tones of 167 beats per minute.  Bilateral ovaries appear normal.  There is an echogenic area in the cervical canal that could be possibly be a polyp.  There is no free fluid.  EDC by ultrasound is February 12, 2020    Past Medical History:   Diagnosis Date   . Acne vulgaris     managed by father who is dermatologist    . Anorexia nervosa 2006    hospitalized x 2   . Generalized anxiety disorder        Past Surgical History:   Procedure Laterality Date   . NO PAST SURGERIES         OB History   Gravida Para Term Preterm AB Living   3 0 0 0 2 0   SAB TAB Ectopic Multiple Live Births   0 2 0 0 0        No Known Allergies    Current Outpatient Medications on File Prior to Visit   Medication Sig Dispense Refill   . acyclovir (ZOVIRAX) 800 MG tablet      . Prenat-FeFum-Fered-FA-DHA w/oA (VITAPEARL) 30-1.4-200 MG CPCR Take 1 tablet by mouth daily. 30 capsule 11     No current facility-administered medications on file prior to  visit.        Review of Systems      EXAM:   BP 108/60   Wt 56.2 kg (124 lb)   LMP 04/30/2019   BMI 21.28 kg/m  Body mass index is 21.28 kg/m.  Physical Exam      Idelia Salm, MD

## 2019-07-10 ENCOUNTER — Encounter (INDEPENDENT_AMBULATORY_CARE_PROVIDER_SITE_OTHER): Payer: BC Managed Care – PPO | Admitting: Gynecology

## 2019-07-10 ENCOUNTER — Other Ambulatory Visit (INDEPENDENT_AMBULATORY_CARE_PROVIDER_SITE_OTHER): Payer: Self-pay

## 2019-07-14 ENCOUNTER — Encounter (HOSPITAL_BASED_OUTPATIENT_CLINIC_OR_DEPARTMENT_OTHER): Payer: Self-pay | Admitting: Hospital

## 2019-07-14 ENCOUNTER — Telehealth (HOSPITAL_BASED_OUTPATIENT_CLINIC_OR_DEPARTMENT_OTHER): Payer: Self-pay

## 2019-07-14 NOTE — Telephone Encounter (Signed)
Pt called this morning she is wanting to see if she can get GC with NIPT. Pt has [employer] and wants the testing and will pay out of pocket I will give pt the information to set up the cash pay contract for [facility] w/NIPT testing.

## 2019-07-15 NOTE — Telephone Encounter (Signed)
Patient is calling to follow up with financial services department. Please see previous messages. Thank you!

## 2019-07-15 NOTE — Telephone Encounter (Signed)
Pt called again she is wanting to pay cash for [facility] & NIPT testing.  Gave pt number to cash pay price line for NIPT code pricing.

## 2019-07-16 ENCOUNTER — Encounter (HOSPITAL_BASED_OUTPATIENT_CLINIC_OR_DEPARTMENT_OTHER): Payer: Self-pay | Admitting: Gynecology

## 2019-07-16 ENCOUNTER — Encounter (HOSPITAL_BASED_OUTPATIENT_CLINIC_OR_DEPARTMENT_OTHER): Payer: Self-pay

## 2019-07-16 ENCOUNTER — Encounter (INDEPENDENT_AMBULATORY_CARE_PROVIDER_SITE_OTHER): Payer: Self-pay | Admitting: Gynecology

## 2019-07-16 DIAGNOSIS — Z331 Pregnant state, incidental: Secondary | ICD-10-CM

## 2019-07-17 ENCOUNTER — Telehealth (INDEPENDENT_AMBULATORY_CARE_PROVIDER_SITE_OTHER): Payer: Self-pay | Admitting: Gynecology

## 2019-07-17 ENCOUNTER — Encounter: Payer: Self-pay | Admitting: Hospital

## 2019-07-17 NOTE — Telephone Encounter (Signed)
Called and spoke with patient. Appointment made for 07/20/2019 to see [facility] and have NIPT drawn. Patient voiced understanding.

## 2019-07-20 ENCOUNTER — Encounter (INDEPENDENT_AMBULATORY_CARE_PROVIDER_SITE_OTHER): Payer: Self-pay | Admitting: Gynecology

## 2019-07-20 ENCOUNTER — Ambulatory Visit (INDEPENDENT_AMBULATORY_CARE_PROVIDER_SITE_OTHER): Payer: BC Managed Care – PPO | Admitting: Gynecology

## 2019-07-20 VITALS — BP 108/64 | Wt 124.0 lb

## 2019-07-20 DIAGNOSIS — Z3401 Encounter for supervision of normal first pregnancy, first trimester: Secondary | ICD-10-CM

## 2019-07-20 DIAGNOSIS — Z369 Encounter for antenatal screening, unspecified: Secondary | ICD-10-CM

## 2019-07-20 NOTE — Progress Notes (Signed)
GYN note    ASSESSMENT AND PLAN:  34 year old [id number] presenting for evaluation of pregnancy at 10 weeks and 3 days..  - noninvasive prenatal testing discussed with the patient.  We discussed the we used the panorama test.  Sensitivities of the testing were discussed with the patient and she would like to proceed.  We also discussed with patient that in general we do not due to screening tests and recommend only nuchal translucency and 2nd trimester screening.  She will discuss this with her future OBGYN at [facility], [facility]          ____________________________________________________________    CC: Recheck      HPI: 34 year old [id number] presenting for evaluation of early pregnancy at 10 weeks and 3 days based on [employer] February 12, 2020 based on ultrasound performed on July 02, 2019. Patient has new insurance and is being seen at [facility].  She desires to do the NIPT but they did not offer the testing to those women who are less than 35.  Thus she would like to do it here and will pay cash for testing and services.    Past Medical History:   Diagnosis Date   . Acne vulgaris     managed by father who is dermatologist    . Anorexia nervosa 2006    hospitalized x 2   . Generalized anxiety disorder        Past Surgical History:   Procedure Laterality Date   . NO PAST SURGERIES         OB History   Gravida Para Term Preterm AB Living   3 0 0 0 2 0   SAB TAB Ectopic Multiple Live Births   0 2 0 0 0        No Known Allergies    Current Outpatient Medications on File Prior to Visit   Medication Sig Dispense Refill   . [DISCONTINUED] acyclovir (ZOVIRAX) 800 MG tablet      . Prenat-FeFum-Fered-FA-DHA w/oA (VITAPEARL) 30-1.4-200 MG CPCR Take 1 tablet by mouth daily. 30 capsule 11     No current facility-administered medications on file prior to visit.        Review of Systems      EXAM:   BP 108/64   Wt 56.2 kg (124 lb)   LMP 04/30/2019   BMI 21.28 kg/m  Body mass index is 21.28 kg/m.  Physical  Exam    Positive fetal heart tones by Doppler at 140s    Idelia Salm, MD

## 2019-07-20 NOTE — Addendum Note (Signed)
Addended by: Idelia Salm on: 07/20/2019 10:19 AM     Modules accepted: Level of Service

## 2019-07-21 ENCOUNTER — Other Ambulatory Visit (INDEPENDENT_AMBULATORY_CARE_PROVIDER_SITE_OTHER): Payer: Self-pay

## 2019-07-21 ENCOUNTER — Encounter (INDEPENDENT_AMBULATORY_CARE_PROVIDER_SITE_OTHER): Payer: Self-pay | Admitting: Obstetrics & Gynecology

## 2019-07-29 ENCOUNTER — Ambulatory Visit (HOSPITAL_BASED_OUTPATIENT_CLINIC_OR_DEPARTMENT_OTHER): Payer: Self-pay | Admitting: MS"

## 2019-09-11 ENCOUNTER — Encounter (INDEPENDENT_AMBULATORY_CARE_PROVIDER_SITE_OTHER): Payer: Self-pay | Admitting: Hospital

## 2019-09-17 ENCOUNTER — Encounter (INDEPENDENT_AMBULATORY_CARE_PROVIDER_SITE_OTHER): Payer: Self-pay | Admitting: Hospital

## 2019-09-17 ENCOUNTER — Encounter (INDEPENDENT_AMBULATORY_CARE_PROVIDER_SITE_OTHER): Payer: Self-pay

## 2020-02-08 MED ORDER — OXYTOCIN-SODIUM CHLORIDE 30-0.9 UT/500ML-% IV SOLN (MILLIUNITS/MIN)
500.00 mL | INTRAVENOUS | Status: DC
Start: ? — End: 2020-02-08

## 2020-02-08 MED ORDER — LACTATED RINGERS IV SOLN
500.00 mL | INTRAVENOUS | Status: DC
Start: ? — End: 2020-02-08

## 2020-02-08 MED ORDER — CARBOPROST TROMETHAMINE 250 MCG/ML IM SOLN
250.00 ug | INTRAMUSCULAR | Status: DC
Start: ? — End: 2020-02-08

## 2020-02-08 MED ORDER — LIDOCAINE HCL 1 % IJ SOLN
0.20 mL | INTRAMUSCULAR | Status: DC
Start: ? — End: 2020-02-08

## 2020-02-08 MED ORDER — METHYLERGONOVINE MALEATE 0.2 MG/ML IJ SOLN
0.20 mg | INTRAMUSCULAR | Status: DC
Start: ? — End: 2020-02-08

## 2020-02-08 MED ORDER — FENTANYL CITRATE (PF) 50 MCG/ML IJ SOLN
50.00 ug | INTRAMUSCULAR | Status: DC
Start: ? — End: 2020-02-08

## 2020-02-08 MED ORDER — SOD CITRATE-CITRIC ACID 500-334 MG/5ML OR SOLN
30.00 mL | ORAL | Status: DC
Start: ? — End: 2020-02-08

## 2020-02-08 MED ORDER — GENERIC EXTERNAL MEDICATION
Status: DC
Start: ? — End: 2020-02-08

## 2020-02-08 MED ORDER — TERBUTALINE SULFATE 1 MG/ML IJ SOLN
0.25 mg | INTRAMUSCULAR | Status: DC
Start: ? — End: 2020-02-08

## 2020-02-08 MED ORDER — LIDOCAINE HCL 1 % IJ SOLN
20.00 mL | INTRAMUSCULAR | Status: DC
Start: ? — End: 2020-02-08

## 2020-02-08 MED ORDER — LACTATED RINGERS IV SOLN
INTRAVENOUS | Status: DC
Start: 2020-02-08 — End: 2020-02-08

## 2020-02-08 MED ORDER — ONDANSETRON HCL 4 MG/2ML IV SOLN
4.00 mg | INTRAMUSCULAR | Status: DC
Start: ? — End: 2020-02-08

## 2020-02-08 MED ORDER — MISOPROSTOL 200 MCG OR TABS
800.00 ug | ORAL_TABLET | ORAL | Status: DC
Start: ? — End: 2020-02-08

## 2020-02-08 MED ORDER — MINERAL OIL OR OIL
120.00 mL | TOPICAL_OIL | ORAL | Status: DC
Start: ? — End: 2020-02-08

## 2020-02-08 MED ORDER — TRANEXAMIC ACID IN NACL 1000 MG/100ML IV SOLN
1000.00 mg | INTRAVENOUS | Status: DC
Start: ? — End: 2020-02-08

## 2020-02-11 MED ORDER — PRENATAL 28-0.8 MG OR TABS
1.00 | ORAL_TABLET | ORAL | Status: DC
Start: 2020-02-12 — End: 2020-02-11

## 2020-02-11 MED ORDER — MAGNESIUM HYDROXIDE 400 MG/5ML OR SUSP
30.00 mL | ORAL | Status: DC
Start: ? — End: 2020-02-11

## 2020-02-11 MED ORDER — DOCUSATE SODIUM 250 MG OR CAPS
250.00 mg | ORAL_CAPSULE | ORAL | Status: DC
Start: 2020-02-11 — End: 2020-02-11

## 2020-02-11 MED ORDER — ALUM & MAG HYDROXIDE-SIMETH 200-200-20 MG/5ML OR SUSP
30.00 mL | ORAL | Status: DC
Start: ? — End: 2020-02-11

## 2020-02-11 MED ORDER — WITCH HAZEL-GLYCERIN EX PADS
1.00 | MEDICATED_PAD | CUTANEOUS | Status: DC
Start: ? — End: 2020-02-11

## 2020-02-11 MED ORDER — METHYLERGONOVINE MALEATE 0.2 MG/ML IJ SOLN
0.20 mg | INTRAMUSCULAR | Status: DC
Start: ? — End: 2020-02-11

## 2020-02-11 MED ORDER — LANSINOH LANOLIN EX CREA
TOPICAL_CREAM | CUTANEOUS | Status: DC
Start: ? — End: 2020-02-11

## 2020-02-11 MED ORDER — MISOPROSTOL 200 MCG OR TABS
800.00 ug | ORAL_TABLET | ORAL | Status: DC
Start: ? — End: 2020-02-11

## 2020-02-11 MED ORDER — PHENYLEPHRINE-MINERAL OIL-PET 0.25-14-74.9 % RE OINT
TOPICAL_OINTMENT | RECTAL | Status: DC
Start: ? — End: 2020-02-11

## 2020-02-11 MED ORDER — BENZOCAINE-MENTHOL 20-0.5 % EX AERO
INHALATION_SPRAY | CUTANEOUS | Status: DC
Start: ? — End: 2020-02-11

## 2020-02-11 MED ORDER — SIMETHICONE 80 MG OR CHEW
80.00 mg | CHEWABLE_TABLET | ORAL | Status: DC
Start: ? — End: 2020-02-11

## 2020-02-11 MED ORDER — ACETAMINOPHEN 325 MG PO TABS
650.00 mg | ORAL_TABLET | ORAL | Status: DC
Start: ? — End: 2020-02-11

## 2020-02-11 MED ORDER — POLYETHYLENE GLYCOL 3350 OR POWD
17.00 g | ORAL | Status: DC
Start: 2020-02-12 — End: 2020-02-11

## 2020-02-11 MED ORDER — IBUPROFEN 600 MG OR TABS
600.00 mg | ORAL_TABLET | ORAL | Status: DC
Start: 2020-02-11 — End: 2020-02-11

## 2020-02-11 MED ORDER — BISACODYL 10 MG RE SUPP
10.00 mg | RECTAL | Status: DC
Start: ? — End: 2020-02-11

## 2020-02-11 MED ORDER — CARBOPROST TROMETHAMINE 250 MCG/ML IM SOLN
250.00 ug | INTRAMUSCULAR | Status: DC
Start: ? — End: 2020-02-11

## 2020-05-10 ENCOUNTER — Encounter (INDEPENDENT_AMBULATORY_CARE_PROVIDER_SITE_OTHER): Payer: Self-pay | Admitting: Hospital

## 2021-03-28 ENCOUNTER — Telehealth: Payer: Self-pay | Admitting: Physician Assistant

## 2021-03-28 MED ORDER — ACYCLOVIR 800 MG PO TABS: ORAL_TABLET | ORAL | 3 refills | Status: AC

## 2021-03-28 NOTE — Telephone Encounter (Signed)
Patient was having a hard time with GI upset with her Valacyclovir for cold sores. I will call in Acyclovir for an attempted alternative.
